# Patient Record
Sex: Male | Born: 1980 | Hispanic: Yes | Marital: Married
Health system: Western US, Community
[De-identification: ages and names within clinical notes are randomized; demographics above are authoritative.]

---

## 2008-02-29 LAB — THROAT CULTURE FOR BETA STREP: Report Status: 3072009

## 2012-05-28 ENCOUNTER — Inpatient Hospital Stay

## 2012-05-28 DIAGNOSIS — J029 Acute pharyngitis, unspecified: Secondary | ICD-10-CM

## 2012-05-28 LAB — THROAT CULTURE FOR BETA STREP

## 2012-09-11 ENCOUNTER — Encounter: Admit: 2012-09-11 | Discharge: 2012-09-11

## 2014-08-03 ENCOUNTER — Emergency Department: Admit: 2014-08-04 | Payer: PRIVATE HEALTH INSURANCE

## 2014-08-03 DIAGNOSIS — R079 Chest pain, unspecified: Secondary | ICD-10-CM

## 2014-08-03 NOTE — ED Provider Notes (Addendum)
[facility]ENC[facility] DEPARTMENT VISIT NOTE     History and Physical     Name: Jason Fischer  DOB: 12/10/1981 33 y.o.  MRN: [id number]  CSN: [id number]    HISTORY:     CHIEF COMPLAINT    Chest discomfort    HPI      Patient is a 33 year old male who presents with chief complaint of intermittent chest discomfort. He has this has been occurring for the last 12 months intermittently. This evening he had an episode that lasted approximately 15 minutes. It was a sharp pain in his right shoulder and does not radiate. It was not pressure-like in nature. He had no pain with deep inspiration. He does indicate that he did feel slightly short of breath at the onset of his pain which has also resolved. He denies any palpitations or lightheadedness. He does indicate that he saw his primary care provider this week due to chest discomfort and an episode of syncope while playing with his son sitting on the ground. He indicates that he has had no prior episodes of syncope and has no exertional pain or syncope. He was referred to cardiology but has not yet seen them. He has not previously had a stress test. He is nonsmoker. No other cardiac risk factors including known hypertension, hyperlipidemia, or diabetes. He does indicate that he had some tingling around his mouth at the onset of this chest discomfort and shortness of breath this evening.    ONSET: 12 months with episode this evening  LOCATION: Chest  SEVERITY: Moderate  QUALITY: Sharp  CONTEXT: as per HPI  TIMING: acute  MODIFYING FACTORS: as above  ASSOCIATED SYMPTOMS: As above. Pain has completely resolved upon arrival in the emergency department.      REVIEW OF SYSTEMS:  Please see HPI.  All other systems were reviewed and are negative.    PAST MEDICAL HISTORY    Patient notes he is otherwise healthy      CURRENT MEDICATIONS    Previous Medications    No medications on file       ALLERGIES    Review of patient's allergies indicates no known allergies.        SOCIAL  HISTORY    History   Substance Use Topics   . Smoking status: No   . Smokeless tobacco: No   . Alcohol Use: No         PHYSICAL EXAM:   INITIAL VITAL SIGNS:    Initial Vitals   BP 08/03/14 2237 172/103 mmHg   Heart Rate 08/03/14 2237 85   Resp 08/03/14 2237 18   Temp 08/03/14 2237 36.8 ?C (98.2 ?F)   SpO2 08/03/14 2237 96 %     Constitutional : No acute distress  HEENT:  Normocephalic, Atraumatic, PERRL. Oropharynx moist, no erythema or exudate.   Neck: Supple, no tenderness, no masses.   Chest:  Normal respiratory effort, clear breath sounds bilaterally.    Cardiovascular:  Regular rate and rhythm, 2+ radial pulses bilaterally.   GI:  Soft, non-tender, no masses.  Normal bowel sounds.  No rebound or guarding.  Extremities: Warm, well perfused.  Non tender. No edema.  Full range of motion  Skin:  No rashes or bruising noted.   Neurologic:  Alert & oriented x 3, nonfocal exam.   Psychiatric: Mood and affect normal.        RESULTS :     LABS    Results for orders placed or performed during the  hospital encounter of 08/03/14 (from the past 24 hour(s))   CBC with Auto Differential -STAT    Collection Time: 08/03/14 11:09 PM   Result Value Ref Range    WBC 7.8 4.8 - 10.8 K/microL    RBC 5.02 4.70 - 6.10 M/microL    Hgb 15.4 12.0 - 18.0 gm/dL    HCT 16.1 09.6 - 04.5 %    MCV 92.1 81 - 99 fL    MCH 30.6 27 - 34 pg    MCHC 33.3 32 - 36 gm/dL    RDW 40.9 81.1 - 91.4 %    Platelet Count 250 150 - 400 K/microL    MPV 9.9 7.4 - 10.4 fL    Differential Type AUTO     Neutrophils % 55 35 - 70 %    Lymphocytes % 34 25 - 45 %    Monocytes % 9 0 - 12 %    Eosinophils % 1 0 - 7 %    Basophils % 1 0 - 2 %    Neutrophils, Absolute 4.2 1.6 - 7.3 K/microL    Lymphocytes, Absolute 2.7 1.1 - 4.3 K/microL    Monocytes, Absolute 0.7 0 - 1.2 K/microL    Eosinophils, Absolute 0.1 0 - 0.7 K/microL    Basophils, Absolute 0.1 0 - 0.2 K/microL   Comprehensive Metabolic Panel -STAT    Collection Time: 08/03/14 11:09 PM   Result Value Ref Range     Sodium 139 135 - 143 mmol/L    Potassium 3.7 3.5 - 5.1 mmol/L    Chloride 102 98 - 111 mmol/L    CO2 - Carbon Dioxide 29.0 22 - 31 mmol/L    Glucose 139 (H) 80 - 99 mg/dL    BUN 12 8 - 21 mg/dL    Creatinine, Serum 7.82 0.64 - 1.27 mg/dL    Calcium 9.1 8.6 - 95.6 mg/dL    AST - Aspartate Aminotransferase 32 10 - 42 IU/L    ALT - Alanine Amino transferase 62 (H) 13 - 60 IU/L    Alkaline Phosphatase 90 37 - 107 IU/L    Bilirubin, Total 0.5 0.2 - 1.4 mg/dL    Protein, Total 7.8 6.1 - 7.9 gm/dL    Albumin 4.1 3.5 - 5.0 gm/dL    Globulin 3.7 2.2 - 3.7 gm/dL    Albumin/Globulin Ratio 1.1 >0.9    Anion Gap 8.0 3 - 11 mmol/L    GFR Estimate >60 >60 mL/min/1.73sq.m    GFR Additional Info       For African Americans, multiply EGFR x 1.21.  See [email or web address]   PT & PTT -STAT    Collection Time: 08/03/14 11:09 PM   Result Value Ref Range    Protime 11.7 10.0 - 13.0 sec    INR 1.1 0.9 - 1.2    APTT 32 23 - 34 sec   Troponin-I -STAT    Collection Time: 08/03/14 11:09 PM   Result Value Ref Range    Troponin I <0.03 0.00 - 0.04 ng/mL       RADIOLOGY/PROCEDURES  X-ray chest PA or AP   Final Result         XR CHEST PA OR AP  08/03/2014 22:56:32      PROVIDED HISTORY: Chest Pain      ADDITIONAL HISTORY: None.      COMPARISON: None.      TECHNIQUE: Frontal view chest.      FINDINGS: Overlying EKG leads demonstrated.  The cardiac silhouette is within normal limits.      Hilar shadows are unremarkable.       The mediastinal silhouette is not widened.       The lung volumes are within normal limits.      The lungs are clear.      The costophrenic angles are sharp.      Bony structures are unremarkable.      IMPRESSION:       1. Unremarkable chest radiograph.          EKG:  Normal sinus rhythm with no evidence of QT prolongation. No evidence of Brugada or Wellens syndrome.t changes that are concerning     ED COURSE: The patient's vital signs were promptly assessed and were initially notable for hypertension which improved with  Ativan.  Oxygen saturation on discharge was 97%.  History significant for intermittent chest discomfort that isn't present for the last 12 months is sharp in nature in his right chest and not associated with exertion or other activities. This evening he did have another episode that lasted approximately 15 minutes, resolved after that, was associated with some shortness of breath and tingling around the mouth. He did see his primary care provider this week and has referral to cardiology as he reported an episode of syncope while playing with his son this week. This was not exertional and he has not had any exertional syncope in the past.  Physical Exam demonstrates no acute abdomen allergies at this time.  He has limited risk factors for pulmonary embolism and no current shortness of breath or tachycardia.  EKG demonstrates normal sinus rhythm with no concerning findings.     Chest x-ray is unremarkable. Laboratory studies demonstrate negative troponin and are otherwise unremarkable. At this time, there is low suspicion for cardiac etiology of chest discomfort. He was not given aspirin as he is not having any active chest discomfort and there is minimal concern for cardiac etiology. Pain is possibly secondary to chest wall discomfort v anxiety.  We do feel that the patient should follow-up with his primary care provider for possible stress test and face sheet was faxed to a clinic for the patient. He does have a referral to cardiology and we encourage him to follow up as planned.   Patient will follow up with otherwise PCP as needed or return to the [facility] immediately with any worsening symptoms.  Patient expressed understanding of all of the instructions provided   ?    CLINICAL IMPRESSION :       SNOMED CT(R)   1. Chest discomfort  CHEST DISCOMFORT       PLAN:    As per ED course    Eleonore Chiquito, MD              **This document was created with the assistance of voice-to-text technology. Effort has been made to  minimize transcription errors. Please allow for homonyms and other similar transcription errors, such as she in place of he and vice versa.Renda Rolls, MD  08/04/14 [id number]    Renda Rolls, MD  08/04/14 [phone]

## 2014-08-03 NOTE — ED Notes (Signed)
Has chest pain x 6 mos.  First time to MD was Tuesday, Told Tuesday EKG showed ischemia. All day had trouble breathing and light-headed. SOB earlier today. Tonight had right sided chest pain lasting 10-15 mins, that was very intense. No nausea. No Cough. No HX HTN.

## 2014-08-03 NOTE — ED Notes (Signed)
Assumed care, pt on monitor, denies pain or symptoms at this point, will continue to monitor.

## 2014-08-03 NOTE — ED Notes (Signed)
Pt arrives to ED c/o ongoing, intermittent, untreated cp x8-10 months, recently worsened and symptoms lasting longer. Pt reports cp comes on suddenly at rest, is in

## 2014-08-04 ENCOUNTER — Inpatient Hospital Stay
Admit: 2014-08-04 | Discharge: 2014-08-04 | Disposition: A | Discharge status: Home / Self Care | Payer: PRIVATE HEALTH INSURANCE | Attending: MD

## 2014-08-04 LAB — CBC WITH AUTO DIFFERENTIAL
Basophils %: 1 % (ref 0–2)
Basophils, Absolute: 0.1 10*3/uL (ref 0–0.2)
Eosinophils %: 1 % (ref 0–7)
Eosinophils, Absolute: 0.1 10*3/uL (ref 0–0.7)
HCT: 46.2 % (ref 42.0–54.0)
Hgb: 15.4 gm/dL (ref 12.0–18.0)
Lymphocytes %: 34 % (ref 25–45)
Lymphocytes, Absolute: 2.7 10*3/uL (ref 1.1–4.3)
MCH: 30.6 pg (ref 27–34)
MCHC: 33.3 gm/dL (ref 32–36)
MCV: 92.1 fL (ref 81–99)
MPV: 9.9 fL (ref 7.4–10.4)
Monocytes %: 9 % (ref 0–12)
Monocytes, Absolute: 0.7 10*3/uL (ref 0–1.2)
Neutrophils, Absolute: 4.2 10*3/uL (ref 1.6–7.3)
Platelet Count: 250 10*3/uL (ref 150–400)
RBC: 5.02 10*6/uL (ref 4.70–6.10)
RDW: 14 % (ref 11.5–14.5)
Segs (Neutrophils)%: 55 % (ref 35–70)
WBC: 7.8 10*3/uL (ref 4.8–10.8)

## 2014-08-04 LAB — PT & PTT
APTT: 32 s (ref 23–34)
INR: 1.1 (ref 0.9–1.2)
Protime: 11.7 s (ref 10.0–13.0)

## 2014-08-04 LAB — TROPONIN I: Troponin I: <0.03 ng/mL (ref 0.00–0.04)

## 2014-08-04 LAB — COMPREHENSIVE METABOLIC PANEL
ALT - Alanine Amino transferase: 62 IU/L — ABNORMAL HIGH (ref 13–60)
AST - Aspartate Aminotransferase: 32 IU/L (ref 10–42)
Albumin/Globulin Ratio: 1.1 (ref >0.9)
Albumin: 4.1 gm/dL (ref 3.5–5.0)
Alkaline Phosphatase: 90 IU/L (ref 37–107)
Anion Gap: 8 mmol/L (ref 3–11)
BUN: 12 mg/dL (ref 8–21)
Bilirubin, Total: 0.5 mg/dL (ref 0.2–1.4)
CO2 - Carbon Dioxide: 29 mmol/L (ref 22–31)
Calcium: 9.1 mg/dL (ref 8.6–10.0)
Chloride: 102 mmol/L (ref 98–111)
Creatinine, Serum: 1.11 mg/dL (ref 0.64–1.27)
GFR Estimate: >60 mL/min/{1.73_m2} (ref >60)
Globulin: 3.7 gm/dL (ref 2.2–3.7)
Glucose: 139 mg/dL — ABNORMAL HIGH (ref 80–99)
Potassium: 3.7 mmol/L (ref 3.5–5.1)
Protein, Total: 7.8 gm/dL (ref 6.1–7.9)
Sodium: 139 mmol/L (ref 135–143)

## 2014-08-04 LAB — B-TYPE NATRIURETIC PEPTIDE: B-Type Natriuretic Peptide - BNP: 7 pg/mL (ref 0–99)

## 2014-08-04 MED ORDER — LORazepam (ATIVAN) tablet 0.5 mg
0.5 | Freq: Once | ORAL | Status: AC
Start: 2014-08-04 — End: 2014-08-03
  Administered 2014-08-04: 07:00:00 0.5 mg via ORAL

## 2014-08-04 MED FILL — LORAZEPAM 0.5 MG TABLET: 0.5 mg | ORAL | Qty: 1

## 2014-08-04 NOTE — Discharge Instructions (Signed)

## 2014-09-11 ENCOUNTER — Inpatient Hospital Stay: Admit: 2014-09-11 | Discharge: 2014-09-11 | Payer: PRIVATE HEALTH INSURANCE | Attending: MD

## 2014-09-11 DIAGNOSIS — I499 Cardiac arrhythmia, unspecified: Secondary | ICD-10-CM

## 2014-10-02 ENCOUNTER — Encounter: Admit: 2014-10-02 | Payer: PRIVATE HEALTH INSURANCE

## 2014-10-02 DIAGNOSIS — I493 Ventricular premature depolarization: Secondary | ICD-10-CM

## 2014-10-02 NOTE — Procedures (Signed)
24-HOUR HOLTER MONITOR    DATE OF PROCEDURE:  October 02, 2014    REASON FOR TEST:  To assess the patient for palpitations and breathless, as  well as chest tightness.    FINDINGS:  3 events were reported.  The first event was for chest tightness  and breathless.  The second event was for tightness.  The third event was  for breathlessness.  Minimum heart rate was 46 beats per minute.  Maximum  heart rate was 146 beats per minute.  Average heart rate was 73 beats per  minute.  There were infrequent episodes of ventricular ectopy, with 122  ventricular ectopics in a 1-hour period.  There were rare couplets.  There  were 10 runs.  There were periods of ventricular bigeminy.  The patient's  chest discomfort was associated with the highest period of ventricular  ectopy.    IMPRESSION:  This is an abnormal Holter monitor, with less than 1% of the  beats being ventricular ectopic beats.  However, the most concentrated  number of those beats occurred during a 1-hour period, associated with the  patient's symptoms.  Further investigation is suggested.      Irwin Brakeman, MD    MMH/cem  D: 10/10/2014 06:41 P   T: 10/12/2014 11:34 P   JOB: [id number]   DOCUMENT:  [id number]    cc: Glendale Chard, MD     Irwin Brakeman, MD     [facility]

## 2014-10-02 NOTE — Progress Notes (Signed)
Holter monitor hooked up. Process was explained to PT, chance to ask questions were given.

## 2014-11-19 ENCOUNTER — Inpatient Hospital Stay: Admit: 2014-11-19 | Discharge: 2014-11-19 | Payer: PRIVATE HEALTH INSURANCE | Attending: MD

## 2014-11-19 DIAGNOSIS — I493 Ventricular premature depolarization: Secondary | ICD-10-CM

## 2014-11-27 ENCOUNTER — Ambulatory Visit: Admit: 2014-11-27 | Discharge: 2014-11-27 | Payer: PRIVATE HEALTH INSURANCE

## 2014-11-27 DIAGNOSIS — N179 Acute kidney failure, unspecified: Secondary | ICD-10-CM

## 2014-11-27 LAB — BASIC METABOLIC PANEL
Anion Gap: 7.2 mmol/L (ref 3–11)
BUN: 21 mg/dL (ref 6.0–23.0)
CO2 - Carbon Dioxide: 28.8 mmol/L (ref 21–31)
Calcium: 9.3 mg/dL (ref 8.6–10.3)
Chloride: 102 mmol/L (ref 98–111)
Creatinine, Serum: 1.47 mg/dL — ABNORMAL HIGH (ref 0.65–1.30)
GFR Estimate: 55 mL/min/{1.73_m2} — ABNORMAL LOW (ref >60)
Glucose: 177 mg/dL — ABNORMAL HIGH (ref 80–99)
Potassium: 4 mmol/L (ref 3.5–5.1)
Sodium: 138 mmol/L (ref 135–143)

## 2014-11-27 LAB — GENERIC ORDERABLE MAYO

## 2015-12-01 ENCOUNTER — Encounter: Admit: 2015-12-01 | Discharge: 2015-12-01

## 2015-12-04 ENCOUNTER — Ambulatory Visit: Admit: 2015-12-04 | Discharge: 2015-12-04 | Attending: PA

## 2015-12-04 ENCOUNTER — Encounter: Admit: 2015-12-04 | Discharge: 2015-12-05

## 2016-02-24 ENCOUNTER — Encounter: Admit: 2016-02-24 | Discharge: 2016-02-24 | Payer: PRIVATE HEALTH INSURANCE | Attending: MD

## 2016-02-24 ENCOUNTER — Encounter: Admit: 2016-02-24 | Payer: PRIVATE HEALTH INSURANCE

## 2016-02-24 DIAGNOSIS — M25521 Pain in right elbow: Secondary | ICD-10-CM

## 2016-02-26 ENCOUNTER — Encounter
Admit: 2016-02-26 | Discharge: 2016-02-26 | Payer: PRIVATE HEALTH INSURANCE | Attending: Rehabilitative and Restorative Service Providers"

## 2016-03-02 ENCOUNTER — Encounter
Admit: 2016-03-03 | Discharge: 2016-03-03 | Payer: PRIVATE HEALTH INSURANCE | Attending: Rehabilitative and Restorative Service Providers"

## 2016-03-04 ENCOUNTER — Encounter
Admit: 2016-03-04 | Discharge: 2016-03-04 | Payer: PRIVATE HEALTH INSURANCE | Attending: Rehabilitative and Restorative Service Providers"

## 2016-03-04 ENCOUNTER — Encounter: Admit: 2016-03-04 | Discharge: 2016-03-04 | Payer: PRIVATE HEALTH INSURANCE | Attending: MD

## 2016-03-08 ENCOUNTER — Encounter
Admit: 2016-03-08 | Discharge: 2016-03-08 | Payer: PRIVATE HEALTH INSURANCE | Attending: Rehabilitative and Restorative Service Providers"

## 2016-03-10 ENCOUNTER — Encounter
Admit: 2016-03-10 | Discharge: 2016-03-10 | Payer: PRIVATE HEALTH INSURANCE | Attending: Rehabilitative and Restorative Service Providers"

## 2016-03-12 ENCOUNTER — Encounter: Admit: 2016-03-12 | Discharge: 2016-03-12 | Payer: PRIVATE HEALTH INSURANCE

## 2016-03-15 ENCOUNTER — Encounter: Admit: 2016-03-15 | Discharge: 2016-03-15 | Payer: PRIVATE HEALTH INSURANCE

## 2016-03-15 ENCOUNTER — Encounter: Admit: 2016-03-15 | Discharge: 2016-03-15 | Payer: PRIVATE HEALTH INSURANCE | Attending: MD

## 2016-03-17 ENCOUNTER — Encounter
Admit: 2016-03-17 | Discharge: 2016-03-17 | Payer: PRIVATE HEALTH INSURANCE | Attending: Rehabilitative and Restorative Service Providers"

## 2016-03-19 ENCOUNTER — Encounter: Admit: 2016-03-19 | Discharge: 2016-03-19 | Payer: PRIVATE HEALTH INSURANCE

## 2016-03-22 ENCOUNTER — Encounter: Admit: 2016-03-22 | Discharge: 2016-03-22 | Payer: PRIVATE HEALTH INSURANCE | Attending: MD

## 2016-03-22 ENCOUNTER — Encounter
Admit: 2016-03-22 | Discharge: 2016-03-22 | Payer: PRIVATE HEALTH INSURANCE | Attending: Rehabilitative and Restorative Service Providers"

## 2016-10-25 ENCOUNTER — Institutional Professional Consult (permissible substitution): Admit: 2016-10-25 | Discharge: 2016-10-25

## 2016-10-25 DIAGNOSIS — Z0283 Encounter for blood-alcohol and blood-drug test: Secondary | ICD-10-CM

## 2016-10-25 NOTE — Progress Notes (Signed)
Please see paper chart for results.

## 2017-04-28 ENCOUNTER — Institutional Professional Consult (permissible substitution): Admit: 2017-04-28 | Discharge: 2017-04-28

## 2017-04-28 DIAGNOSIS — Z0283 Encounter for blood-alcohol and blood-drug test: Secondary | ICD-10-CM

## 2017-04-28 NOTE — Progress Notes (Signed)
Please see paper chart for results.

## 2017-11-14 ENCOUNTER — Encounter: Admit: 2017-11-14 | Discharge: 2017-11-14

## 2017-11-14 DIAGNOSIS — Z024 Encounter for examination for driving license: Secondary | ICD-10-CM

## 2017-11-14 NOTE — Progress Notes (Signed)
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 No    Chaperone consent:  Verbal consent for hernia check - no chaperone.    See scanned documents for related paper based physical documents.

## 2018-02-27 ENCOUNTER — Institutional Professional Consult (permissible substitution): Admit: 2018-02-27 | Attending: Medical

## 2018-02-27 DIAGNOSIS — Z0283 Encounter for blood-alcohol and blood-drug test: Secondary | ICD-10-CM

## 2018-02-27 NOTE — Progress Notes (Signed)
DOT Drug Screen    Reason for Testing: Random    Unless stated otherwise perform Blood Alcohol Test.  Bat Performed? No    Employer: [employer]    Identification Verified by: Photo ID    Complete Chain of Custody Checklist  1. Notify patient of DOT regulations  2. Fill out Chain of Custody document  ? Fill in employer name and address (if available)- Yes  ? Document patient SSN- Yes  ? Record requesting agency- Yes  ? Document reason for testing- Yes  ? Record specimen temperature- Yes  ? Document single or split sample- Yes  ? Have patient sign Chain of Custody- Yes  ? Sign Chain of Custody as collector- Yes    Perform collection of specimen.    Please see paper chart for results.

## 2019-02-14 ENCOUNTER — Institutional Professional Consult (permissible substitution): Admit: 2019-02-14 | Discharge: 2019-02-14

## 2019-02-14 DIAGNOSIS — Z0283 Encounter for blood-alcohol and blood-drug test: Secondary | ICD-10-CM

## 2019-02-14 NOTE — Progress Notes (Signed)
Please see paper chart for results.

## 2019-02-26 ENCOUNTER — Institutional Professional Consult (permissible substitution): Admit: 2019-02-26 | Discharge: 2019-02-26

## 2019-02-26 DIAGNOSIS — Z0283 Encounter for blood-alcohol and blood-drug test: Secondary | ICD-10-CM

## 2019-02-26 NOTE — Progress Notes (Signed)
[id number]   Please see paper chart for results.

## 2019-10-25 ENCOUNTER — Encounter: Admit: 2019-10-25 | Attending: PA

## 2019-10-25 DIAGNOSIS — Z024 Encounter for examination for driving license: Secondary | ICD-10-CM

## 2019-10-25 LAB — POCT URINE DIPSTICK, APP
POCT Bilirubin Dipstick: NEGATIVE
POCT Blood Dipstick: NEGATIVE
POCT Ketones Dipstick: NEGATIVE
POCT Leukocytes Dipstick: NEGATIVE
POCT Nitrite Dipstick: NEGATIVE
POCT Protein Dipstick: NEGATIVE
POCT Specific Gravity Dipstick: 1.005 (ref 1.000–1.030)
POCT Urine Glucose Dipstick: NEGATIVE
POCT Urobilinogen Dipstick: NORMAL
POCT pH Dipstick: 6 (ref 5.0–9.0)

## 2019-10-25 NOTE — Progress Notes (Signed)
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 No    Chaperone consent:  No hernia exam indicated.    See scanned documents for related paper based physical documents.

## 2020-10-03 ENCOUNTER — Ambulatory Visit: Admit: 2020-10-04 | Payer: PRIVATE HEALTH INSURANCE

## 2020-10-03 DIAGNOSIS — Z23 Encounter for immunization: Secondary | ICD-10-CM

## 2020-10-24 ENCOUNTER — Encounter

## 2021-03-05 ENCOUNTER — Institutional Professional Consult (permissible substitution): Admit: 2021-03-05 | Discharge: 2021-03-06

## 2021-03-05 DIAGNOSIS — Z021 Encounter for pre-employment examination: Secondary | ICD-10-CM

## 2021-03-05 NOTE — Progress Notes (Signed)
DOT Drug Screen

## 2021-08-11 ENCOUNTER — Encounter: Attending: FNP

## 2021-08-12 ENCOUNTER — Encounter: Admit: 2021-08-12 | Discharge: 2021-08-12 | Attending: FNP

## 2021-08-12 DIAGNOSIS — Z0283 Encounter for blood-alcohol and blood-drug test: Secondary | ICD-10-CM

## 2021-08-12 NOTE — Progress Notes (Signed)
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    Discovered Problem Identified? No    See scanned documents for related paper based physical documents.

## 2021-08-12 NOTE — Progress Notes (Deleted)
FOLLOW UP INJURY    Patient Name: Jason Fischer  Date of Birth: 1981-05-04  Date of Injury:      No flowsheet data found.    Chief Complaint: Physical (Standard DOT physical.)      History of Present Injury: This 40 y.o. male presents to [facility] for follow up.    HPI    Employment Status: ***    No outpatient medications have been marked as taking for the 08/12/21 encounter (Work Health Physical) with Gardiner Fanti, FNP.       No Known Allergies    Review of Systems     Vitals:    08/12/21 0829   BP: 112/74   Pulse: 64   Resp: 18    Body mass index is 36.92 kg/m?Marland Kitchen      Snellen Eye Exam:  Uncorrected Right Eye Far: 20/40  Uncorrected Left Eye Far: 20/30  Uncorrected Both Eye Far: 20/25    Physical Exam  Ortho Exam    No results found.     Assessment:    ICD-10-CM    1. Encounter for drug screening  Z02.83 POCT Urine Dipstick       Medical Decision Making: ***    Plan:  ***    Temporary Work Restrictions: {YES__/NO:21728}  No follow-ups on file.    Medically Stationary: {[facility] Medically Stationary:[id number]::No}    See associated lab or imaging reports.    This note was transcribed using speech recognition software. Please contact us for clarification if any questions arise relating to the wording of this document.

## 2023-05-04 IMAGING — MR MRI SHOULDER RT W/O CONTRAST
7 series · 40 of 40 positions shown · IV contrast (gadolinium)
Comparison: None.

﻿ MRI SHOULDER
INDICATION: Right shoulder
TECHNIQUE: MRI of the right shoulder was performed on a low field strength open MRI scanner.  Multiplanar, multisequence imaging was performed including coronal T1, T2 and inversion recovery; sagittal T2 and IR; and axial T2 gradient echo sequences without gadolinium.

[Series 5: scano c/s/t · coronal · right · 8.0mm · 0.86mm/px · 1 of 5 slices shown]
[im 1/5]
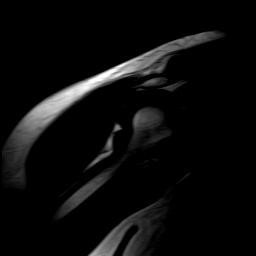

[Series 6: T2 · axial · right · 4.0mm · 0.82mm/px · z∈[-32,+5]mm · 4 of 9 slices shown (1 of 3)]
[im 1/9]
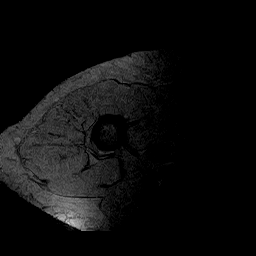
[im 3/9]
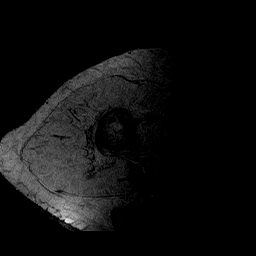
[im 6/9]
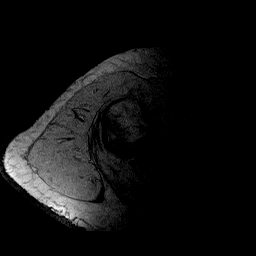
[im 9/9]
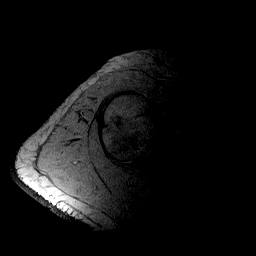

[Series 7: T2 · oblique · right · 4.0mm · 0.94mm/px · 7 of 16 slices shown (2 of 3)]
[im 1/16]
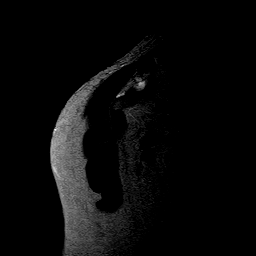
[im 3/16]
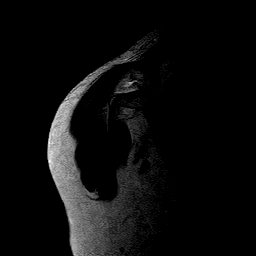
[im 6/16]
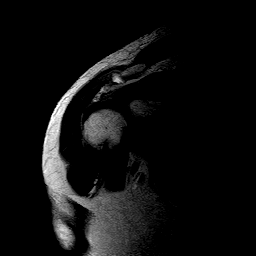
[im 8/16]
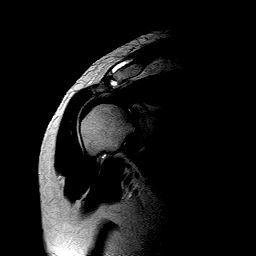
[im 11/16]
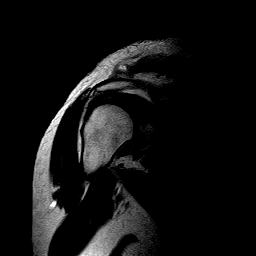
[im 13/16]
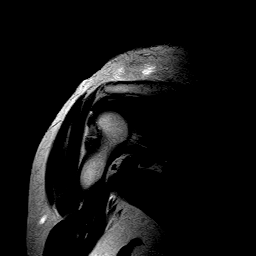
[im 16/16]
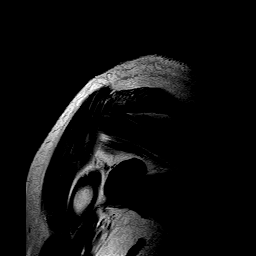

[Series 8: ir cor · oblique · right · 4.0mm · 0.94mm/px · 7 of 16 slices shown]
[im 1/16]
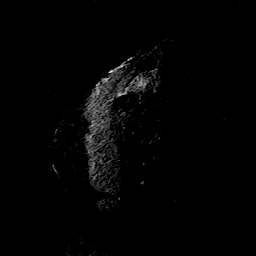
[im 3/16]
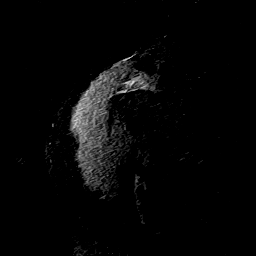
[im 6/16]
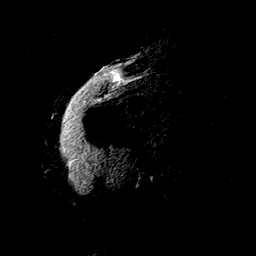
[im 8/16]
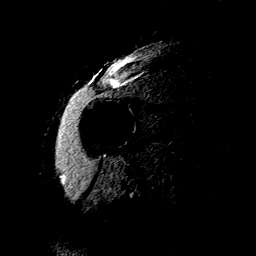
[im 11/16]
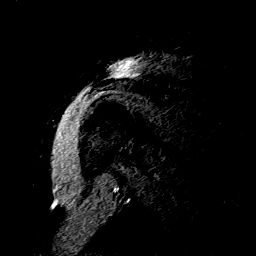
[im 13/16]
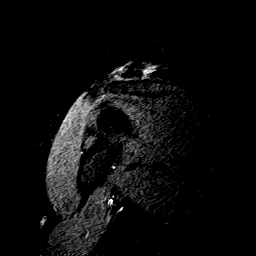
[im 16/16]
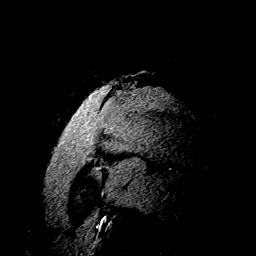

[Series 9: T1 · oblique · right · 4.0mm · 0.47mm/px · 7 of 16 slices shown]
[im 1/16]
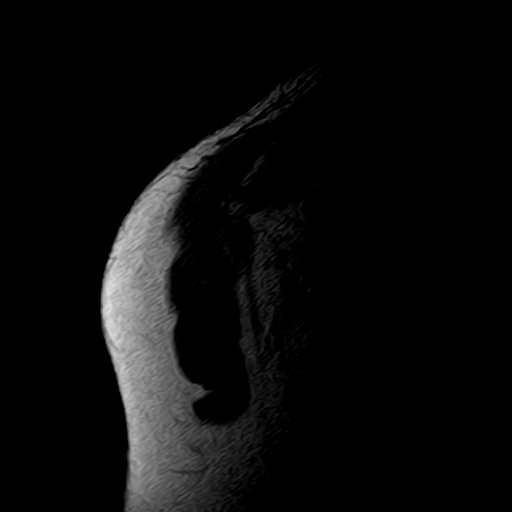
[im 3/16]
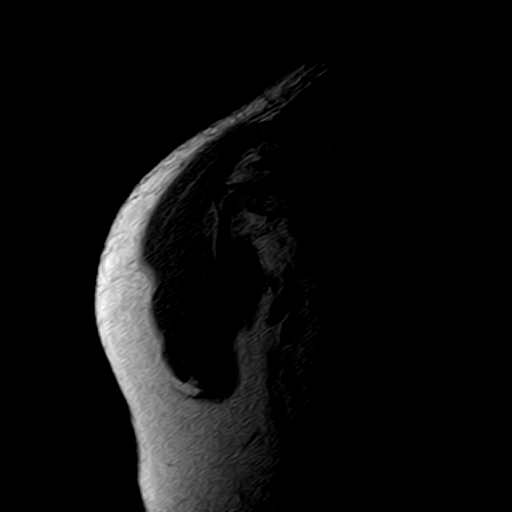
[im 6/16]
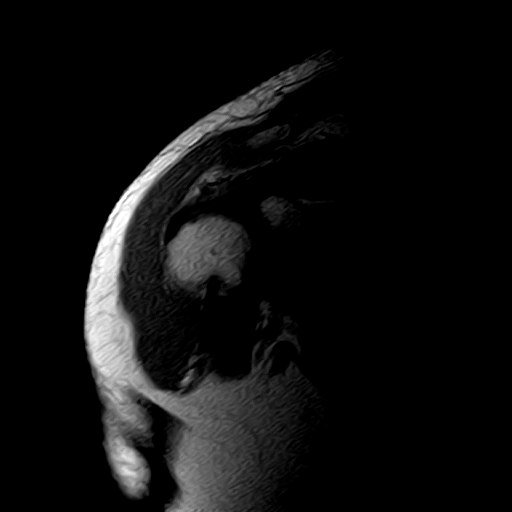
[im 8/16]
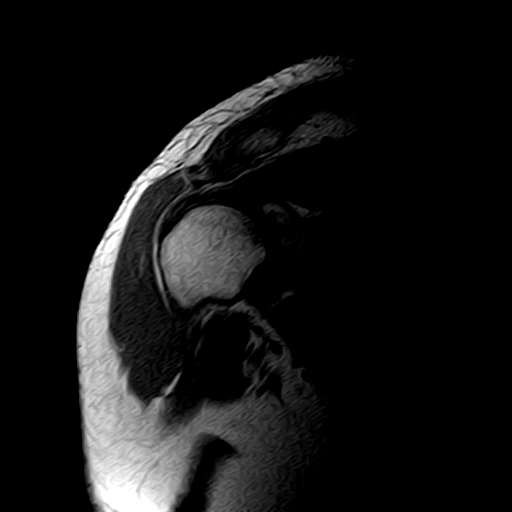
[im 11/16]
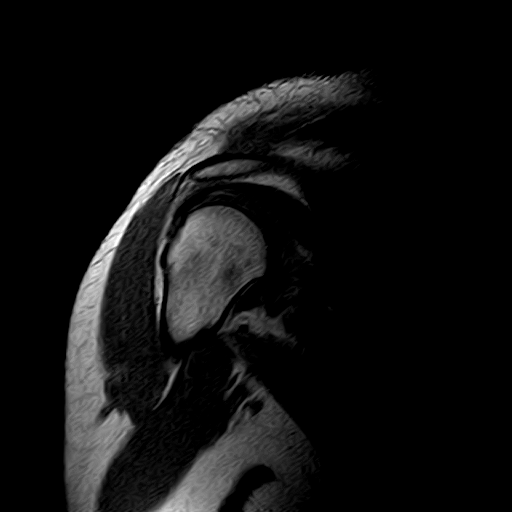
[im 13/16]
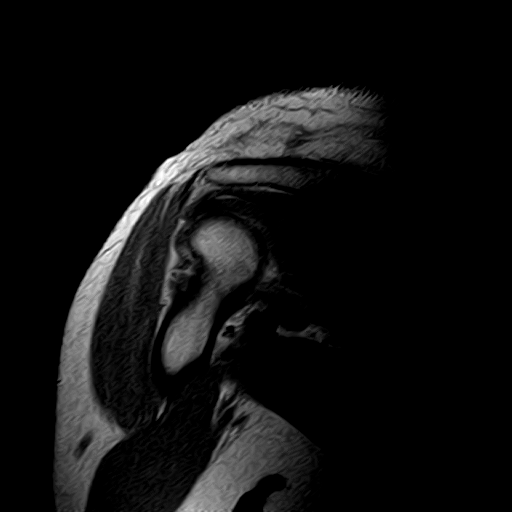
[im 16/16]
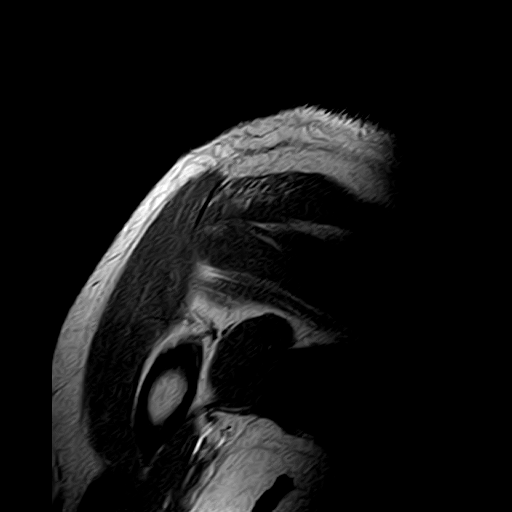

[Series 10: T2 · oblique · right · 4.0mm · 0.82mm/px · 7 of 16 slices shown (3 of 3)]
[im 1/16]
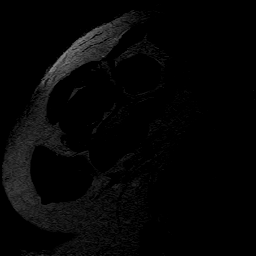
[im 3/16]
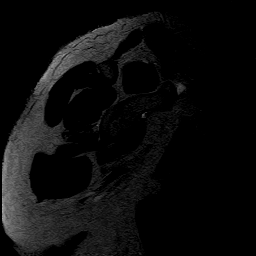
[im 6/16]
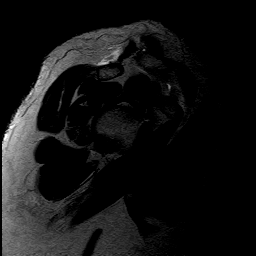
[im 8/16]
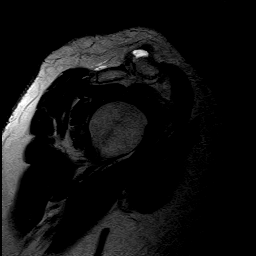
[im 11/16]
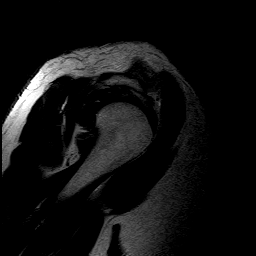
[im 13/16]
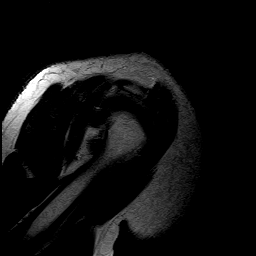
[im 16/16]
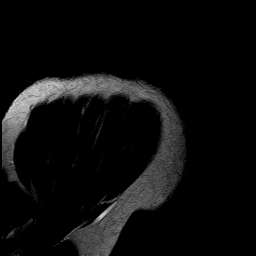

[Series 11: ir sag · oblique · right · 4.0mm · 0.82mm/px · 7 of 16 slices shown]
[im 1/16]
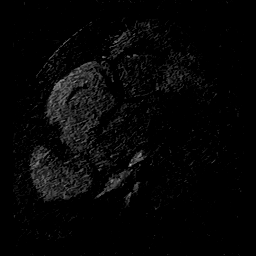
[im 3/16]
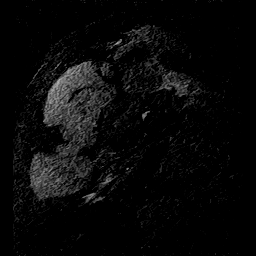
[im 6/16]
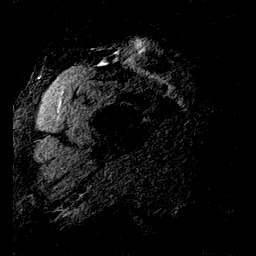
[im 8/16]
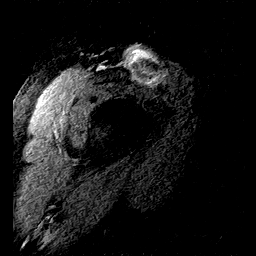
[im 11/16]
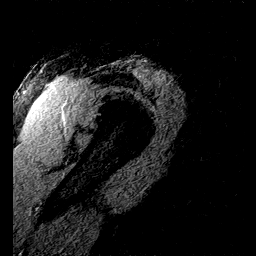
[im 13/16]
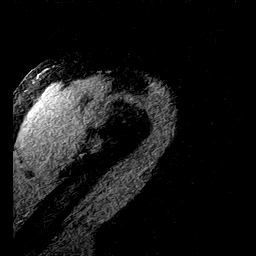
[im 16/16]
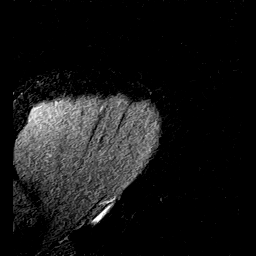

[40 of 40 positions shown; findings below may reference images not displayed]

FINDINGS: Rotator cuff: Supraspinatus, infraspinatus, subscapularis, and teres minor are intact.  No rotator cuff muscle atrophy or fatty infiltration is identified.

Labrum: Within the limitations of insufficient joint distention and low field strength, no labral tear is identified.

Biceps: The contour, course, caliber, and signal of the long head of biceps tendon is within normal limits. 

Ligament/Capsule: There is no capsular hypertrophy or pericapsular edema.  The rotator interval signal is normal.

Cartilage/Bone: No Hill Sachs or osseous Bankart lesion is identified. Marrow signal is within normal limits. 

Acromioclavicular joint: Type II acromial morphology .  There is marked AC joint intracapsular and pericapsular edema with fluid accumulation distending the joint capsule.  Fluid decompresses superiorly along the clavicular head undermining the trapezius muscle.  There is associated edema within the trapezius muscle.  Additionally, there is disruption of the AC joint capsule, compatible with grade 2 AC joint separation injury.  Additionally, there is bone marrow edema along the distal subchondral surface of the clavicle, compatible with superimposed distal clavicular fracture.

Others: There is no subacromial/subdeltoid bursal fluid accumulation or joint effusion
IMPRESSION: Grade II AC joint separation injury with superimposed distal clavicular subchondral fracture.
